# Patient Record
Sex: Male | Born: 1965 | Race: White | Hispanic: No | Marital: Single | Smoking: Never smoker
Health system: Southern US, Community
[De-identification: ages and names within clinical notes are randomized; demographics above are authoritative.]

---

## 2007-03-04 ENCOUNTER — Ambulatory Visit (HOSPITAL_COMMUNITY): Admission: RE | Admit: 2007-03-04 | Discharge: 2007-03-04 | Payer: Self-pay | Admitting: Family Medicine

## 2013-12-18 ENCOUNTER — Encounter (INDEPENDENT_AMBULATORY_CARE_PROVIDER_SITE_OTHER): Payer: BC Managed Care – PPO | Admitting: Ophthalmology

## 2013-12-18 DIAGNOSIS — H33309 Unspecified retinal break, unspecified eye: Secondary | ICD-10-CM

## 2013-12-18 DIAGNOSIS — H43819 Vitreous degeneration, unspecified eye: Secondary | ICD-10-CM

## 2013-12-29 ENCOUNTER — Encounter (INDEPENDENT_AMBULATORY_CARE_PROVIDER_SITE_OTHER): Payer: BC Managed Care – PPO | Admitting: Ophthalmology

## 2013-12-29 DIAGNOSIS — H33309 Unspecified retinal break, unspecified eye: Secondary | ICD-10-CM

## 2013-12-31 ENCOUNTER — Encounter (INDEPENDENT_AMBULATORY_CARE_PROVIDER_SITE_OTHER): Payer: BC Managed Care – PPO | Admitting: Ophthalmology

## 2014-01-12 ENCOUNTER — Ambulatory Visit (INDEPENDENT_AMBULATORY_CARE_PROVIDER_SITE_OTHER): Payer: BC Managed Care – PPO | Admitting: Ophthalmology

## 2014-01-14 ENCOUNTER — Ambulatory Visit (INDEPENDENT_AMBULATORY_CARE_PROVIDER_SITE_OTHER): Payer: BC Managed Care – PPO | Admitting: Ophthalmology

## 2014-01-14 DIAGNOSIS — H33309 Unspecified retinal break, unspecified eye: Secondary | ICD-10-CM

## 2014-04-30 ENCOUNTER — Ambulatory Visit (INDEPENDENT_AMBULATORY_CARE_PROVIDER_SITE_OTHER): Payer: BC Managed Care – PPO | Admitting: Emergency Medicine

## 2014-04-30 VITALS — BP 106/70 | HR 87 | Temp 98.1°F | Resp 16 | Ht 69.0 in | Wt 174.8 lb

## 2014-04-30 DIAGNOSIS — L929 Granulomatous disorder of the skin and subcutaneous tissue, unspecified: Secondary | ICD-10-CM

## 2014-04-30 DIAGNOSIS — L03011 Cellulitis of right finger: Secondary | ICD-10-CM

## 2014-04-30 MED ORDER — ACETAMINOPHEN-CODEINE #3 300-30 MG PO TABS: 1.0000 | ORAL_TABLET | ORAL | Status: DC | PRN

## 2014-04-30 MED ORDER — SULFAMETHOXAZOLE-TMP DS 800-160 MG PO TABS: 1.0000 | ORAL_TABLET | Freq: Two times a day (BID) | ORAL | Status: DC

## 2014-04-30 NOTE — Progress Notes (Signed)
Procedure: Verbal consent obtained.  The patient was anesthetized with 1% lidocaine via third metacarpal block on the right palmar surface.  The area was prepped and draped in the usual sterile manner.  A hockey stick incision was made through the paronychia and purulence was expressed.  Granulation tissue was excised.  A dressing was placed.  The patient tolerated the procedure without complication.   Deliah BostonMichael Lille Karim, MS, PA-C  04/30/2014 5:09 PM

## 2014-04-30 NOTE — Patient Instructions (Signed)

## 2014-04-30 NOTE — Progress Notes (Signed)
[facility] [address], [place] [id number] [phone]  Date:  04/30/2014   Name:  Greg Marquez   DOB:  1966-01-25   MRN:  [id number]  PCP:  No PCP Per Patient    Chief Complaint: right middle finger throbbing x 7 days   History of Present Illness:  Greg Marquez is a 48 y.o. very pleasant male patient who presents with the following:  Has a paronychia right third finger. No fever or chills.  No history of recent injury or FB. Drains purulent drainage radial aspect with granulation tissue that is expending. No improvement with over the counter medications or other home remedies. Denies other complaint or health concern today.    There are no active problems to display for this patient.   No past medical history on file.  No past surgical history on file.  History  Substance Use Topics  . Smoking status: Never Smoker   . Smokeless tobacco: Not on file  . Alcohol Use: No    No family history on file.  No Known Allergies  Medication list has been reviewed and updated.  No current outpatient prescriptions on file prior to visit.   No current facility-administered medications on file prior to visit.    Review of Systems:  As per HPI, otherwise negative.    Physical Examination: Filed Vitals:   04/30/14 1634  BP: 106/70  Pulse: 87  Temp: 98.1 F (36.7 C)  Resp: 16   Filed Vitals:   04/30/14 1634  Height: 5\' 9"  (1.753 m)  Weight: 174 lb 12.8 oz (79.289 kg)   Body mass index is 25.8 kg/(m^2). Ideal Body Weight: Weight in (lb) to have BMI = 25: 168.9   GEN: WDWN, NAD, Non-toxic, Alert & Oriented x 3 HEENT: Atraumatic, Normocephalic.  Ears and Nose: No external deformity. EXTR: No clubbing/cyanosis/edema NEURO: Normal gait.  PSYCH: Normally interactive. Conversant. Not depressed or anxious appearing.  Calm demeanor.  RIGHT third finger paronychia with granulation tissue.   Assessment and Plan: Paronychia I&D   Cauterization granulation tissue Septra   Signed,  Phillips OdorJeffery Raheel Kunkle, MD

## 2014-05-18 ENCOUNTER — Ambulatory Visit (INDEPENDENT_AMBULATORY_CARE_PROVIDER_SITE_OTHER): Payer: BC Managed Care – PPO | Admitting: Ophthalmology

## 2015-07-19 ENCOUNTER — Ambulatory Visit (INDEPENDENT_AMBULATORY_CARE_PROVIDER_SITE_OTHER): Payer: BLUE CROSS/BLUE SHIELD | Admitting: Family Medicine

## 2015-07-19 VITALS — BP 126/80 | HR 83 | Temp 98.0°F | Resp 16 | Ht 69.0 in | Wt 172.0 lb

## 2015-07-19 DIAGNOSIS — J069 Acute upper respiratory infection, unspecified: Secondary | ICD-10-CM

## 2015-07-19 DIAGNOSIS — T700XXA Otitic barotrauma, initial encounter: Secondary | ICD-10-CM | POA: Diagnosis not present

## 2015-07-19 DIAGNOSIS — H6981 Other specified disorders of Eustachian tube, right ear: Secondary | ICD-10-CM

## 2015-07-19 MED ORDER — IPRATROPIUM BROMIDE 0.03 % NA SOLN: 2.0000 | Freq: Four times a day (QID) | NASAL | Status: DC

## 2015-07-19 MED ORDER — AZITHROMYCIN 250 MG PO TABS: ORAL_TABLET | ORAL | Status: DC

## 2015-07-19 MED ORDER — HYDROCOD POLST-CPM POLST ER 10-8 MG/5ML PO SUER: 5.0000 mL | Freq: Every evening | ORAL | Status: DC | PRN

## 2015-07-19 MED ORDER — FLUTICASONE PROPIONATE 50 MCG/ACT NA SUSP: 2.0000 | Freq: Every day | NASAL | Status: DC

## 2015-07-19 MED ORDER — PREDNISONE 20 MG PO TABS: ORAL_TABLET | ORAL | Status: DC

## 2015-07-19 NOTE — Progress Notes (Signed)
Subjective:  By signing my name below, I, Stann Oresung-Kai Tsai, attest that this documentation has been prepared under the direction and in the presence of Norberto SorensonEva Tahmir Kleckner, MD. Electronically Signed: Stann Oresung-Kai Tsai, Scribe. 07/19/2015 , 5:00 PM .  Patient was seen in Room 12 .   Patient ID: Greg Marquez Coca, male    DOB: 01-12-1966, 50 y.o.   MRN: [id number] Chief Complaint  Patient presents with  . Cough    x 2 days   . Nasal Congestion    x 1 week   . ear clogged    right, x last night   . Emesis    x 1 day    HPI Greg Marquez Warrior is a 50 y.o. male who [facility] complaining of illness that started a week ago. He started feeling fatigue and congestion with cough that started 2 days ago. He notes vomiting last night with feeling nausea. He also mentions not being able to hear out of his right ear starting yesterday morning when waking up. He describes the feeling similar to being in the mountains. He's been scratching the right ear canal without relief. He's taken mucinex and nasal decongestant similar to sudafed without relief. He denies history of smoking. He denies history of asthma. He denies fever and chills.   His informs that his wife has sinusitis with 2 rounds of zpak that started around christmas. He also notes that his coworkers have similar symptoms.   History reviewed. No pertinent past medical history. Prior to Admission medications   Not on File   No Known Allergies  Review of Systems  Constitutional: Positive for fatigue. Negative for fever and chills.  HENT: Positive for congestion, ear pain and hearing loss.   Respiratory: Positive for cough. Negative for shortness of breath and wheezing.   Gastrointestinal: Positive for nausea and vomiting. Negative for abdominal pain, diarrhea and constipation.       Objective:   Physical Exam  Constitutional: He is oriented to person, place, and time. He appears well-developed and well-nourished. No distress.  HENT:  Head:  Normocephalic and atraumatic.  Right Ear: Tympanic membrane is erythematous and retracted.  Left Ear: Tympanic membrane is erythematous and retracted.  Nose: Rhinorrhea (clear) present.  Mouth/Throat: Posterior oropharyngeal erythema present.  Right ear canal with abrasion Nasal erythema  Eyes: EOM are normal. Pupils are equal, round, and reactive to light.  Neck: Neck supple. No thyromegaly present.  Cardiovascular: Normal rate, regular rhythm and normal heart sounds.   No murmur heard. Pulmonary/Chest: Effort normal. No respiratory distress. He has rhonchi (inspiratory, cleared with deep breathing) in the right lower field.  Musculoskeletal: Normal range of motion.  Lymphadenopathy:    He has no cervical adenopathy.  Neurological: He is alert and oriented to person, place, and time.  Skin: Skin is warm and dry.  Psychiatric: He has a normal mood and affect. His behavior is normal.  Nursing note and vitals reviewed.   BP 126/80 mmHg  Pulse 83  Temp(Src) 98 F (36.7 C) (Oral)  Resp 16  Ht 5\' 9"  (1.753 m)  Wt 172 lb (78.019 kg)  BMI 25.39 kg/m2  SpO2 97%     Assessment & Plan:   1. Acute upper respiratory infection   2. Barotitis media, initial encounter   3. Eustachian tube dysfunction, right   Start nasal steroid, nasal saline, hot steam, humidifier, continue sudafed to help with right barotitis media. Abraded ear canal with manipulation trying to "make ear pop" so  can do hydrogen peroxide or vinegar/tea tree oil drops to prevent supra-infection while healing. Do not think he needs an antibiotic at this point but gave snap zpack hard rx in case sxs worsen and warned of immunosuppression on prednisone.  Meds ordered this encounter  Medications  . predniSONE (DELTASONE) 20 MG tablet    Sig: 3 tabs x 2d, 2 tabs x 2d, 1 tab x 3d. Start 07/20/15 a.m.    Dispense:  13 tablet    Refill:  0  . chlorpheniramine-HYDROcodone (TUSSIONEX PENNKINETIC ER) 10-8 MG/5ML SUER    Sig: Take  5 mLs by mouth at bedtime as needed.    Dispense:  90 mL    Refill:  0  . azithromycin (ZITHROMAX) 250 MG tablet    Sig: Take 2 tabs PO x 1 dose, then 1 tab PO QD x 4 days    Dispense:  6 tablet    Refill:  0  . ipratropium (ATROVENT) 0.03 % nasal spray    Sig: Place 2 sprays into the nose 4 (four) times daily.    Dispense:  30 mL    Refill:  1  . fluticasone (FLONASE) 50 MCG/ACT nasal spray    Sig: Place 2 sprays into both nostrils at bedtime.    Dispense:  16 g    Refill:  2    I personally performed the services described in this documentation, which was scribed in my presence. The recorded information has been reviewed and considered, and addended by me as needed.  Norberto Sorenson, MD MPH

## 2015-07-19 NOTE — Patient Instructions (Addendum)
Hot showers or breathing in steam may help loosen the congestion.  Using a netti pot or sinus rinse is also likely to help you feel better and keep this from progressing.  Use the atrovent nasal spray scheduled four times throughout the day for the next 5 days at least.  I recommend augmenting with 12 hr sudafed (behind the counter) and generic mucinex to help you move out the congestion.  If no improvement or you are getting worse, you can fill the antibiotic prescription.  If your ear pain  Using nasal saline spray frequently throughout the day and using a nasal steroid spray (fluticasone) at night for 2 weeks can also help with opening up the eustachian tube. Consider placing a cool mist humidifier next to your bed and put you head over a pot of boiling water to help open up the middle ear.  If it is not working, come back.  Barotitis Media Barotitis media is inflammation of your middle ear. This occurs when the auditory tube (eustachian tube) leading from the back of your nose (nasopharynx) to your eardrum is blocked. This blockage may result from a cold, environmental allergies, or an upper respiratory infection. Unresolved barotitis media may lead to damage or hearing loss (barotrauma), which may become permanent. HOME CARE INSTRUCTIONS   Use medicines as recommended by your health care provider. Over-the-counter medicines will help unblock the canal and can help during times of air travel.  Do not put anything into your ears to clean or unplug them. Eardrops will not be helpful.  Do not swim, dive, or fly until your health care provider says it is all right to do so. If these activities are necessary, chewing gum with frequent, forceful swallowing may help. It is also helpful to hold your nose and gently blow to pop your ears for equalizing pressure changes. This forces air into the eustachian tube.  Only take over-the-counter or prescription medicines for pain, discomfort, or fever as directed  by your health care provider.  A decongestant may be helpful in decongesting the middle ear and make pressure equalization easier. SEEK MEDICAL CARE IF:  You experience a serious form of dizziness in which you feel as if the room is spinning and you feel nauseated (vertigo).  Your symptoms only involve one ear. SEEK IMMEDIATE MEDICAL CARE IF:   You develop a severe headache, dizziness, or severe ear pain.  You have bloody or pus-like drainage from your ears.  You develop a fever.  Your problems do not improve or become worse. MAKE SURE YOU:   Understand these instructions.  Will watch your condition.  Will get help right away if you are not doing well or get worse.   This information is not intended to replace advice given to you by your health care provider. Make sure you discuss any questions you have with your health care provider.   Document Released: 06/23/2000 Document Revised: 04/16/2013 Document Reviewed: 01/21/2013 Elsevier Interactive Patient Education [employer]2016 Elsevier Inc.

## 2016-05-02 ENCOUNTER — Ambulatory Visit (INDEPENDENT_AMBULATORY_CARE_PROVIDER_SITE_OTHER): Payer: BLUE CROSS/BLUE SHIELD | Admitting: Family Medicine

## 2016-05-02 VITALS — BP 104/68 | HR 74 | Temp 98.1°F | Resp 17 | Ht 69.0 in | Wt 168.0 lb

## 2016-05-02 DIAGNOSIS — B029 Zoster without complications: Secondary | ICD-10-CM | POA: Diagnosis not present

## 2016-05-02 MED ORDER — GABAPENTIN 100 MG PO CAPS: 100.0000 mg | ORAL_CAPSULE | Freq: Two times a day (BID) | ORAL | 0 refills | Status: DC

## 2016-05-02 MED ORDER — ACYCLOVIR 200 MG PO CAPS: 200.0000 mg | ORAL_CAPSULE | Freq: Every day | ORAL | 0 refills | Status: DC

## 2016-05-02 NOTE — Patient Instructions (Addendum)
Acyclovir 200 mg 5 times daily for 7 days.  Take Gabapentin 100 mg twice daily as needed for neuropathic pain. First few doses may cause drowsiness. Take at bedtime.   IF you received an x-ray today, you will receive an invoice from [facility]. Please contact [facility] at [phone] with questions or concerns regarding your invoice.   IF you received labwork today, you will receive an invoice from [employer]. Please contact [facility] at [phone] with questions or concerns regarding your invoice.   Our billing staff will not be able to assist you with questions regarding bills from these companies.  You will be contacted with the lab results as soon as they are available. The fastest way to get your results is to activate your My Chart account. Instructions are located on the last page of this paperwork. If you have not heard from [place] regarding the results in 2 weeks, please contact this office.     Shingles Shingles is an infection that causes a painful skin rash and fluid-filled blisters. Shingles is caused by the same virus that causes chickenpox. Shingles only develops in people who:  Have had chickenpox.  Have gotten the chickenpox vaccine. (This is rare.) The first symptoms of shingles may be itching, tingling, or pain in an area on your skin. A rash will follow in a few days or weeks. The rash is usually on one side of the body in a bandlike or beltlike pattern. Over time, the rash turns into fluid-filled blisters that break open, scab over, and dry up. Medicines may:  Help you manage pain.  Help you recover more quickly.  Help to prevent long-term problems. HOME CARE Medicines  Take medicines only as told by your doctor.  Apply an anti-itch or numbing cream to the affected area as told by your doctor. Blister and Rash Care  Take a cool bath or put cool compresses on the area of the rash or blisters as told by your  doctor. This may help with pain and itching.  Keep your rash covered with a loose bandage (dressing). Wear loose-fitting clothing.  Keep your rash and blisters clean with mild soap and cool water or as told by your doctor.  Check your rash every day for signs of infection. These include redness, swelling, and pain that lasts or gets worse.  Do not pick your blisters.  Do not scratch your rash. General Instructions  Rest as told by your doctor.  Keep all follow-up visits as told by your doctor. This is important.  Until your blisters scab over, your infection can cause chickenpox in people who have never had it or been vaccinated against it. To prevent this from happening, avoid touching other people or being around other people, especially:  Babies.  Pregnant women.  Children who have eczema.  Elderly people who have transplants.  People who have chronic illnesses, such as leukemia or AIDS. GET HELP IF:  Your pain does not get better with medicine.  Your pain does not get better after the rash heals.  Your rash looks infected. Signs of infection include:  Redness.  Swelling.  Pain that lasts or gets worse. GET HELP RIGHT AWAY IF:  The rash is on your face or nose.  You have pain in your face, pain around your eye area, or loss of feeling on one side of your face.  You have ear pain or you have ringing in your ear.  You have loss of taste.  Your condition gets worse.   This information is not intended to replace advice given to you by your health care provider. Make sure you discuss any questions you have with your health care provider.   Document Released: 12/13/2007 Document Revised: 07/17/2014 Document Reviewed: 04/07/2014 Elsevier Interactive Patient Education [employer]2016 Elsevier Inc.

## 2016-05-02 NOTE — Progress Notes (Signed)
   Patient ID: Greg PummelJason T Marquez, male    DOB: 06-18-1966, 50 y.o.   MRN: [id number]  PCP: No PCP Per Patient  Chief Complaint  Patient presents with  . Rash    Rt lower leg onset 1 week, itching and painful    Subjective:   HPI 50 year old male, presents for evaluation of rash on right lower leg times 1 week. Describes the rash as discomfort with some itching which progressed to burning and tingling. Denies any other lesion anywhere else on body. Reports the rash feels exactly like a prior rash which turned out to be shingles times 3-4 years ago. He describes rash as located on the anterior right leg, very red, and slightly swollen. He reports his only other symptoms include feeling of increased fatigued. Denies headache, fever, nausea, or vomiting.  Social History   Social History  . Marital status: Single    Spouse name: N/A  . Number of children: N/A  . Years of education: N/A   Occupational History  . Not on file.   Social History Main Topics  . Smoking status: Never Smoker  . Smokeless tobacco: Never Used  . Alcohol use No  . Drug use: No  . Sexual activity: Not on file   Other Topics Concern  . Not on file   Social History Narrative  . No narrative on file   No family history on file.   Review of Systems  SEE HPI  There are no active problems to display for this patient.  Prior to Admission medications   Not on File    No Known Allergies     Objective:  Physical Exam  Constitutional: He is oriented to person, place, and time. He appears well-developed and well-nourished.  HENT:  Head: Normocephalic and atraumatic.  Right Ear: External ear normal.  Left Ear: External ear normal.  Mouth/Throat: Oropharynx is clear and moist.  Eyes: Conjunctivae and EOM are normal. Pupils are equal, round, and reactive to light.  Neck: Normal range of motion. Neck supple.  Cardiovascular: Normal rate, regular rhythm, normal heart sounds and intact distal pulses.     Pulmonary/Chest: Effort normal and breath sounds normal.  Musculoskeletal: Normal range of motion.       Legs: Neurological: He is alert and oriented to person, place, and time.  Skin: Skin is warm and dry.  Psychiatric: He has a normal mood and affect. His behavior is normal. Judgment and thought content normal.     Vitals:   05/02/16 0928  BP: 104/68  Pulse: 74  Resp: 17  Temp: 98.1 F (36.7 C)     Assessment & Plan:  1. Herpes zoster without complication  Plan: . acyclovir (ZOVIRAX) 200 MG capsule    Sig: Take 1 capsule (200 mg total) by mouth 5 (five) times daily.  Marland Kitchen. gabapentin (NEURONTIN) 100 MG capsule    Sig: Take 1 capsule (100 mg total) by mouth 2 (two) times daily.   Follow-up as needed.  Godfrey PickKimberly S. Tiburcio PeaHarris, MSN, FNP-C [facility]

## 2016-05-17 ENCOUNTER — Ambulatory Visit (INDEPENDENT_AMBULATORY_CARE_PROVIDER_SITE_OTHER): Payer: BLUE CROSS/BLUE SHIELD | Admitting: Family Medicine

## 2016-05-17 VITALS — BP 120/62 | HR 72 | Temp 98.2°F | Resp 16 | Ht 69.0 in | Wt 170.0 lb

## 2016-05-17 DIAGNOSIS — R21 Rash and other nonspecific skin eruption: Secondary | ICD-10-CM | POA: Diagnosis not present

## 2016-05-17 MED ORDER — CLOTRIMAZOLE-BETAMETHASONE 1-0.05 % EX CREA: 1.0000 "application " | TOPICAL_CREAM | Freq: Two times a day (BID) | CUTANEOUS | 1 refills | Status: DC

## 2016-05-17 NOTE — Progress Notes (Signed)
   Patient ID: Greg Marquez, male    DOB: 1966/04/06, 50 y.o.   MRN: [id number]  PCP: No PCP Per Patient  Chief Complaint  Patient presents with  . Follow-up    Shingles, Out of Acyclovir    Subjective:   HPI 50 year old male presents for evaluation of skin rash of right lower leg. [facility]. He was seen and treated 05/02/16 for shingles infection of the lower right leg. Pt reports improvement of burning of rash on right leg and now his leg itches continuously. Reports treatment with acyclovir seemed to improve pain initially but redness and rash never improved or resolved. He denies any fever, fatigue, or development of rash anywhere else on his body.  Social History   Social History  . Marital status: Single    Spouse name: N/A  . Number of children: N/A  . Years of education: N/A   Occupational History  . Not on file.   Social History Main Topics  . Smoking status: Never Smoker  . Smokeless tobacco: Never Used  . Alcohol use No  . Drug use: No  . Sexual activity: Not on file   Other Topics Concern  . Not on file   Social History Narrative  . No narrative on file   No family history on file.  Review of Systems See HPI  There are no active problems to display for this patient.    Prior to Admission medications   Medication Sig Start Date End Date Taking? Authorizing Provider  acyclovir (ZOVIRAX) 200 MG capsule Take 1 capsule (200 mg total) by mouth 5 (five) times daily. 05/02/16  Yes Doyle AskewKimberly Stephenia Vernestine Brodhead, FNP  gabapentin (NEURONTIN) 100 MG capsule Take 1 capsule (100 mg total) by mouth 2 (two) times daily. 05/02/16  Yes Doyle AskewKimberly Stephenia Latasha Puskas, FNP   No Known Allergies     Objective:  Physical Exam  Constitutional: He is oriented to person, place, and time. He appears well-developed and well-nourished.  HENT:  Head: Normocephalic and atraumatic.  Eyes: Conjunctivae and EOM are normal. Pupils are equal, round, and reactive to light.    Cardiovascular: Normal rate.   Pulmonary/Chest: Effort normal.  Musculoskeletal: Normal range of motion.       Legs: Neurological: He is alert and oriented to person, place, and time.  Skin: Skin is warm, dry and intact. Rash noted. Rash is macular.   Vitals:   05/17/16 0838  BP: 120/62  Pulse: 72  Resp: 16  Temp: 98.2 F (36.8 C)     Assessment & Plan:  1. Rash and nonspecific skin eruption  Plan:  . clotrimazole-betamethasone (LOTRISONE) cream    Sig: Apply 1 application topically 2 (two) times daily.   If rash worsens or doesn't improve, will consider treatment with a systemic antiinflammatory taper with prednisone.  Contact me within the next 3 days if rash is not improving.  Godfrey PickKimberly S. Tiburcio PeaHarris, MSN, FNP-C [facility]

## 2016-05-17 NOTE — Patient Instructions (Addendum)
Apply Lotrisone Cream after cleaning leg with soap and water 1 application twice daily for two weeks.  Call me on Friday if symptoms are not improving.  At that time I will consider placing you on an oral steriod.    IF you received an x-ray today, you will receive an invoice from [facility]. Please contact [facility] at [phone] with questions or concerns regarding your invoice.   IF you received labwork today, you will receive an invoice from [facility]. Please contact [facility] at [phone] with questions or concerns regarding your invoice.   Our billing staff will not be able to assist you with questions regarding bills from these companies.  You will be contacted with the lab results as soon as they are available. The fastest way to get your results is to activate your My Chart account. Instructions are located on the last page of this paperwork. If you have not heard from us regarding the results in 2 weeks, please contact this office.     Contact Dermatitis Dermatitis is redness, soreness, and swelling (inflammation) of the skin. Contact dermatitis is a reaction to certain substances that touch the skin. There are two types of contact dermatitis:   Irritant contact dermatitis. This type is caused by something that irritates your skin, such as dry hands from washing them too much. This type does not require previous exposure to the substance for a reaction to occur. This type is more common.  Allergic contact dermatitis. This type is caused by a substance that you are allergic to, such as a nickel allergy or poison ivy. This type only occurs if you have been exposed to the substance (allergen) before. Upon a repeat exposure, your body reacts to the substance. This type is less common. CAUSES  Many different substances can cause contact dermatitis. Irritant contact dermatitis is most commonly caused by exposure to:   Makeup.    Soaps.   Detergents.   Bleaches.   Acids.   Metal salts, such as nickel.  Allergic contact dermatitis is most commonly caused by exposure to:   Poisonous plants.   Chemicals.   Jewelry.   Latex.   Medicines.   Preservatives in products, such as clothing.  RISK FACTORS This condition is more likely to develop in:   People who have jobs that expose them to irritants or allergens.  People who have certain medical conditions, such as asthma or eczema.  SYMPTOMS  Symptoms of this condition may occur anywhere on your body where the irritant has touched you or is touched by you. Symptoms include:  Dryness or flaking.   Redness.   Cracks.   Itching.   Pain or a burning feeling.   Blisters.  Drainage of small amounts of blood or clear fluid from skin cracks. With allergic contact dermatitis, there may also be swelling in areas such as the eyelids, mouth, or genitals.  DIAGNOSIS  This condition is diagnosed with a medical history and physical exam. A patch skin test may be performed to help determine the cause. If the condition is related to your job, you may need to see an occupational medicine specialist. TREATMENT Treatment for this condition includes figuring out what caused the reaction and protecting your skin from further contact. Treatment may also include:   Steroid creams or ointments. Oral steroid medicines may be needed in more severe cases.  Antibiotics or antibacterial ointments, if a skin infection is present.  Antihistamine lotion or an antihistamine taken by  mouth to ease itching.  A bandage (dressing). HOME CARE INSTRUCTIONS Skin Care  Moisturize your skin as needed.   Apply cool compresses to the affected areas.  Try taking a bath with:  Epsom salts. Follow the instructions on the packaging. You can get these at your local pharmacy or grocery store.  Baking soda. Pour a small amount into the bath as directed by your  health care provider.  Colloidal oatmeal. Follow the instructions on the packaging. You can get this at your local pharmacy or grocery store.  Try applying baking soda paste to your skin. Stir water into baking soda until it reaches a paste-like consistency.  Do not scratch your skin.  Bathe less frequently, such as every other day.  Bathe in lukewarm water. Avoid using hot water. Medicines  Take or apply over-the-counter and prescription medicines only as told by your health care provider.   If you were prescribed an antibiotic medicine, take or apply your antibiotic as told by your health care provider. Do not stop using the antibiotic even if your condition starts to improve. General Instructions  Keep all follow-up visits as told by your health care provider. This is important.  Avoid the substance that caused your reaction. If you do not know what caused it, keep a journal to try to track what caused it. Write down:  What you eat.  What cosmetic products you use.  What you drink.  What you wear in the affected area. This includes jewelry.  If you were given a dressing, take care of it as told by your health care provider. This includes when to change and remove it. SEEK MEDICAL CARE IF:   Your condition does not improve with treatment.  Your condition gets worse.  You have signs of infection such as swelling, tenderness, redness, soreness, or warmth in the affected area.  You have a fever.  You have new symptoms. SEEK IMMEDIATE MEDICAL CARE IF:   You have a severe headache, neck pain, or neck stiffness.  You vomit.  You feel very sleepy.  You notice red streaks coming from the affected area.  Your bone or joint underneath the affected area becomes painful after the skin has healed.  The affected area turns darker.  You have difficulty breathing.   This information is not intended to replace advice given to you by your health care provider. Make sure  you discuss any questions you have with your health care provider.   Document Released: 06/23/2000 Document Revised: 03/17/2015 Document Reviewed: 11/11/2014 Elsevier Interactive Patient Education [employer]

## 2017-11-07 ENCOUNTER — Encounter: Payer: Self-pay | Admitting: Podiatry

## 2017-11-07 ENCOUNTER — Ambulatory Visit (INDEPENDENT_AMBULATORY_CARE_PROVIDER_SITE_OTHER): Payer: BLUE CROSS/BLUE SHIELD | Admitting: Podiatry

## 2017-11-07 ENCOUNTER — Ambulatory Visit: Payer: BLUE CROSS/BLUE SHIELD

## 2017-11-07 DIAGNOSIS — M21619 Bunion of unspecified foot: Secondary | ICD-10-CM

## 2017-11-07 NOTE — Progress Notes (Signed)
   HPI: 52 year old male referral from Dr. Elijah Birk presents today for evaluation regarding a bunion and hammertoes to the left foot.  Patient states that he was seen by Dr. Elijah Birk for evaluation of chronic foot pain to the left foot.  X-rays were taken at Dr. Tasia Catchings office.  He has the x-rays with him today and he was subsequently referred here for surgical consultation.  He states that he believes that he possibly has a diagnosis of rheumatoid arthritis however he has not been seen by rheumatologist.  He has been having symptoms in his hands and his feet for the past few years.  He presents for further treatment evaluation  No past medical history on file.   Physical Exam: General: The patient is alert and oriented x3 in no acute distress.  Dermatology: Skin is warm, dry and supple bilateral lower extremities. Negative for open lesions or macerations.  Vascular: Palpable pedal pulses bilaterally. No edema or erythema noted. Capillary refill within normal limits.  Neurological: Epicritic and protective threshold grossly intact bilaterally.   Musculoskeletal Exam: Hammertoe deformity with complete dislocation of the MPJs noted to digits 2-5 of the left foot.  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  Range of motion within normal limits to all pedal and ankle joints bilateral. Muscle strength 5/5 in all groups bilateral.   X-rays taken at Dr. Tasia Catchings office are consistent with rheumatoid foot.  There is bony erosion of the metatarsal heads 2-5.  Dorsal dislocation of the digits on the metatarsal heads noted.  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  Assessment: 1.  Rheumatoid foot left 2.  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  Bunion deformity left foot   Plan of Care:  1. Patient evaluated. X-Rays reviewed.  2.  Today we discussed surgical intervention to alleviate the patient's symptoms.  Surgery would consist of metatarsal head resections  digits 2-5 of the left foot.  First MTPJ arthrodesis left foot. 3.  The patient has not been seen by rheumatology.  I would recommend that the patient follow-up with his primary care physician to get a referral to rheumatology for his symptoms regarding his hands. 4.  The patient seems very hesitant regarding surgery at the moment.  Recommend that he return to the clinic when he is ready for surgical intervention.  In the meantime recommend good supportive shoe gear. 5.  Return to clinic as needed  Dr. Elijah Birk referral      Felecia Shelling, DPM [facility]  Dr. Felecia Shelling, DPM    [address] [place], [place] [address]                Office [phone]  Fax [phone]
